# Patient Record
Sex: Male | Born: 1995 | Race: Black or African American | Hispanic: No | Marital: Single | Smoking: Never smoker
Health system: Southern US, Community
[De-identification: ages and names within clinical notes are randomized; demographics above are authoritative.]

---
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
## 2015-11-17 ENCOUNTER — Inpatient Hospital Stay (HOSPITAL_COMMUNITY)
Admission: EM | Admit: 2015-11-17 | Discharge: 2015-11-21 | DRG: 908 | Disposition: A | Discharge status: Home / Self Care | Payer: BLUE CROSS/BLUE SHIELD | Attending: Vascular Surgery | Admitting: Vascular Surgery

## 2015-11-17 ENCOUNTER — Emergency Department (HOSPITAL_COMMUNITY): Payer: BLUE CROSS/BLUE SHIELD

## 2015-11-17 ENCOUNTER — Emergency Department (HOSPITAL_COMMUNITY): Payer: BLUE CROSS/BLUE SHIELD | Admitting: Anesthesiology

## 2015-11-17 ENCOUNTER — Encounter (HOSPITAL_COMMUNITY): Payer: Self-pay | Admitting: Adult Health

## 2015-11-17 ENCOUNTER — Encounter (HOSPITAL_COMMUNITY)
Admission: EM | Disposition: A | Discharge status: Home / Self Care | Payer: Self-pay | Source: Home / Self Care | Attending: Vascular Surgery

## 2015-11-17 DIAGNOSIS — S45901A Unspecified injury of unspecified blood vessel at shoulder and upper arm level, right arm, initial encounter: Secondary | ICD-10-CM

## 2015-11-17 DIAGNOSIS — S5411XA Injury of median nerve at forearm level, right arm, initial encounter: Secondary | ICD-10-CM | POA: Diagnosis present

## 2015-11-17 DIAGNOSIS — S0181XA Laceration without foreign body of other part of head, initial encounter: Secondary | ICD-10-CM

## 2015-11-17 DIAGNOSIS — F10929 Alcohol use, unspecified with intoxication, unspecified: Secondary | ICD-10-CM | POA: Diagnosis present

## 2015-11-17 DIAGNOSIS — S45111A Laceration of brachial artery, right side, initial encounter: Principal | ICD-10-CM | POA: Diagnosis present

## 2015-11-17 DIAGNOSIS — W19XXXA Unspecified fall, initial encounter: Secondary | ICD-10-CM

## 2015-11-17 DIAGNOSIS — W3400XA Accidental discharge from unspecified firearms or gun, initial encounter: Secondary | ICD-10-CM

## 2015-11-17 DIAGNOSIS — D62 Acute posthemorrhagic anemia: Secondary | ICD-10-CM | POA: Diagnosis not present

## 2015-11-17 DIAGNOSIS — S41131A Puncture wound without foreign body of right upper arm, initial encounter: Secondary | ICD-10-CM | POA: Diagnosis present

## 2015-11-17 DIAGNOSIS — S45191A Other specified injury of brachial artery, right side, initial encounter: Secondary | ICD-10-CM

## 2015-11-17 DIAGNOSIS — S45101A Unspecified injury of brachial artery, right side, initial encounter: Secondary | ICD-10-CM | POA: Diagnosis present

## 2015-11-17 DIAGNOSIS — M79621 Pain in right upper arm: Secondary | ICD-10-CM | POA: Diagnosis present

## 2015-11-17 HISTORY — PX: FASCIOTOMY: SHX132

## 2015-11-17 HISTORY — PX: THROMBECTOMY BRACHIAL ARTERY: SHX6649

## 2015-11-17 LAB — COMPREHENSIVE METABOLIC PANEL
ALT: 33 U/L (ref 17–63)
AST: 24 U/L (ref 15–41)
Albumin: 4.1 g/dL (ref 3.5–5.0)
Alkaline Phosphatase: 65 U/L (ref 38–126)
Anion gap: 11 (ref 5–15)
BILIRUBIN TOTAL: 0.6 mg/dL (ref 0.3–1.2)
BUN: 13 mg/dL (ref 6–20)
CHLORIDE: 104 mmol/L (ref 101–111)
CO2: 21 mmol/L — ABNORMAL LOW (ref 22–32)
CREATININE: 0.94 mg/dL (ref 0.61–1.24)
Calcium: 8.8 mg/dL — ABNORMAL LOW (ref 8.9–10.3)
GFR calc Af Amer: >60 mL/min (ref >60)
Glucose, Bld: 152 mg/dL — ABNORMAL HIGH (ref 65–99)
Potassium: 3.1 mmol/L — ABNORMAL LOW (ref 3.5–5.1)
Sodium: 136 mmol/L (ref 135–145)
TOTAL PROTEIN: 6.7 g/dL (ref 6.5–8.1)

## 2015-11-17 LAB — CBC
HCT: 45 % (ref 39.0–52.0)
Hemoglobin: 14.6 g/dL (ref 13.0–17.0)
MCH: 29.4 pg (ref 26.0–34.0)
MCHC: 32.4 g/dL (ref 30.0–36.0)
MCV: 90.7 fL (ref 78.0–100.0)
Platelets: 187 10*3/uL (ref 150–400)
RBC: 4.96 MIL/uL (ref 4.22–5.81)
RDW: 13.9 % (ref 11.5–15.5)
WBC: 8.5 10*3/uL (ref 4.0–10.5)

## 2015-11-17 LAB — PREPARE RBC (CROSSMATCH)

## 2015-11-17 LAB — PROTIME-INR
INR: 0.95
PROTHROMBIN TIME: 12.7 s (ref 11.4–15.2)

## 2015-11-17 LAB — I-STAT CG4 LACTIC ACID, ED: LACTIC ACID, VENOUS: 4.32 mmol/L — AB (ref 0.5–1.9)

## 2015-11-17 LAB — ETHANOL: ALCOHOL ETHYL (B): 254 mg/dL — AB (ref <5)

## 2015-11-17 LAB — MRSA PCR SCREENING: MRSA BY PCR: NEGATIVE

## 2015-11-17 LAB — ABO/RH: ABO/RH(D): B POS

## 2015-11-17 LAB — BLOOD PRODUCT ORDER (VERBAL) VERIFICATION

## 2015-11-17 LAB — CDS SEROLOGY

## 2015-11-17 SURGERY — THROMBECTOMY, ARTERY, BRACHIAL
Anesthesia: General | Site: Arm Lower | Laterality: Right

## 2015-11-17 MED ORDER — PANTOPRAZOLE SODIUM 40 MG PO TBEC
40.0000 mg | DELAYED_RELEASE_TABLET | Freq: Every day | ORAL | Status: DC
  Administered 2015-11-21: 40 mg via ORAL
  Filled 2015-11-17 (×5): qty 1

## 2015-11-17 MED ORDER — METOPROLOL TARTRATE 5 MG/5ML IV SOLN: 2.0000 mg | INTRAVENOUS | Status: DC | PRN

## 2015-11-17 MED ORDER — FENTANYL CITRATE (PF) 100 MCG/2ML IJ SOLN
INTRAMUSCULAR | Status: AC
  Filled 2015-11-17: qty 2

## 2015-11-17 MED ORDER — ROCURONIUM BROMIDE 10 MG/ML (PF) SYRINGE
PREFILLED_SYRINGE | INTRAVENOUS | Status: AC
  Filled 2015-11-17: qty 10

## 2015-11-17 MED ORDER — SUCCINYLCHOLINE CHLORIDE 20 MG/ML IJ SOLN
INTRAMUSCULAR | Status: DC | PRN
  Administered 2015-11-17: 60 mg via INTRAVENOUS

## 2015-11-17 MED ORDER — GUAIFENESIN-DM 100-10 MG/5ML PO SYRP: 15.0000 mL | ORAL_SOLUTION | ORAL | Status: DC | PRN

## 2015-11-17 MED ORDER — SODIUM CHLORIDE 0.9 % IV SOLN: 500.0000 mL | Freq: Once | INTRAVENOUS | Status: DC | PRN

## 2015-11-17 MED ORDER — MORPHINE SULFATE (PF) 2 MG/ML IV SOLN: 2.0000 mg | Freq: Once | INTRAVENOUS | Status: DC

## 2015-11-17 MED ORDER — DIPHENHYDRAMINE HCL 50 MG/ML IJ SOLN
INTRAMUSCULAR | Status: AC
  Filled 2015-11-17: qty 1

## 2015-11-17 MED ORDER — SODIUM CHLORIDE 0.9 % IV SOLN
INTRAVENOUS | Status: DC | PRN
  Administered 2015-11-17 (×2): via INTRAVENOUS

## 2015-11-17 MED ORDER — MIDAZOLAM HCL 2 MG/2ML IJ SOLN
INTRAMUSCULAR | Status: AC
  Filled 2015-11-17: qty 2

## 2015-11-17 MED ORDER — MAGNESIUM SULFATE 2 GM/50ML IV SOLN
2.0000 g | Freq: Every day | INTRAVENOUS | Status: DC | PRN
Start: 2015-11-17 — End: 2015-11-21
  Filled 2015-11-17: qty 50

## 2015-11-17 MED ORDER — HYDROMORPHONE HCL 1 MG/ML IJ SOLN
INTRAMUSCULAR | Status: AC
  Filled 2015-11-17: qty 2

## 2015-11-17 MED ORDER — HYDROMORPHONE HCL 1 MG/ML IJ SOLN
0.2500 mg | INTRAMUSCULAR | Status: DC | PRN
  Administered 2015-11-20: 0.5 mg via INTRAVENOUS
  Filled 2015-11-17: qty 1

## 2015-11-17 MED ORDER — ONDANSETRON HCL 4 MG/2ML IJ SOLN
INTRAMUSCULAR | Status: AC
  Filled 2015-11-17: qty 2

## 2015-11-17 MED ORDER — PROPOFOL 10 MG/ML IV BOLUS
INTRAVENOUS | Status: AC
  Filled 2015-11-17: qty 40

## 2015-11-17 MED ORDER — HEPARIN SODIUM (PORCINE) 5000 UNIT/ML IJ SOLN
INTRAMUSCULAR | Status: DC | PRN
  Administered 2015-11-17: 04:00:00

## 2015-11-17 MED ORDER — ROCURONIUM BROMIDE 100 MG/10ML IV SOLN
INTRAVENOUS | Status: DC | PRN
  Administered 2015-11-17: 50 mg via INTRAVENOUS

## 2015-11-17 MED ORDER — ACETAMINOPHEN 160 MG/5ML PO SOLN: 325.0000 mg | ORAL | Status: DC | PRN

## 2015-11-17 MED ORDER — MIDAZOLAM HCL 5 MG/5ML IJ SOLN
INTRAMUSCULAR | Status: DC | PRN
  Administered 2015-11-17: 2 mg via INTRAVENOUS

## 2015-11-17 MED ORDER — SUGAMMADEX SODIUM 200 MG/2ML IV SOLN
INTRAVENOUS | Status: AC
  Filled 2015-11-17: qty 2

## 2015-11-17 MED ORDER — SODIUM CHLORIDE 0.9 % IV SOLN
Freq: Once | INTRAVENOUS | Status: AC
  Administered 2015-11-17: 03:00:00 via INTRAVENOUS

## 2015-11-17 MED ORDER — IOPAMIDOL (ISOVUE-300) INJECTION 61%
INTRAVENOUS | Status: AC
  Filled 2015-11-17: qty 50

## 2015-11-17 MED ORDER — HYDRALAZINE HCL 20 MG/ML IJ SOLN: 5.0000 mg | INTRAMUSCULAR | Status: DC | PRN

## 2015-11-17 MED ORDER — HEPARIN SODIUM (PORCINE) 1000 UNIT/ML IJ SOLN
INTRAMUSCULAR | Status: DC | PRN
  Administered 2015-11-17: 7000 [IU] via INTRAVENOUS

## 2015-11-17 MED ORDER — FENTANYL CITRATE (PF) 100 MCG/2ML IJ SOLN
INTRAMUSCULAR | Status: AC
  Filled 2015-11-17: qty 4

## 2015-11-17 MED ORDER — DEXTROSE 5 % IV SOLN
1.5000 g | Freq: Two times a day (BID) | INTRAVENOUS | Status: AC
  Administered 2015-11-17 – 2015-11-18 (×2): 1.5 g via INTRAVENOUS
  Filled 2015-11-17 (×3): qty 1.5

## 2015-11-17 MED ORDER — DIPHENHYDRAMINE HCL 50 MG/ML IJ SOLN
INTRAMUSCULAR | Status: DC | PRN
  Administered 2015-11-17: 25 mg via INTRAVENOUS

## 2015-11-17 MED ORDER — PROPOFOL 10 MG/ML IV BOLUS
INTRAVENOUS | Status: DC | PRN
  Administered 2015-11-17: 200 mg via INTRAVENOUS

## 2015-11-17 MED ORDER — FENTANYL CITRATE (PF) 250 MCG/5ML IJ SOLN
INTRAMUSCULAR | Status: DC | PRN
  Administered 2015-11-17: 150 ug via INTRAVENOUS
  Administered 2015-11-17: 50 ug via INTRAVENOUS
  Administered 2015-11-17: 100 ug via INTRAVENOUS
  Administered 2015-11-17 (×2): 50 ug via INTRAVENOUS

## 2015-11-17 MED ORDER — OXYCODONE HCL 5 MG PO TABS: 5.0000 mg | ORAL_TABLET | Freq: Once | ORAL | Status: DC | PRN

## 2015-11-17 MED ORDER — ACETAMINOPHEN 325 MG PO TABS: 325.0000 mg | ORAL_TABLET | ORAL | Status: DC | PRN

## 2015-11-17 MED ORDER — OXYCODONE HCL 5 MG/5ML PO SOLN: 5.0000 mg | Freq: Once | ORAL | Status: DC | PRN

## 2015-11-17 MED ORDER — ONDANSETRON HCL 4 MG/2ML IJ SOLN: 4.0000 mg | Freq: Once | INTRAMUSCULAR | Status: DC

## 2015-11-17 MED ORDER — ACETAMINOPHEN 325 MG RE SUPP: 325.0000 mg | RECTAL | Status: DC | PRN

## 2015-11-17 MED ORDER — SUCCINYLCHOLINE CHLORIDE 200 MG/10ML IV SOSY
PREFILLED_SYRINGE | INTRAVENOUS | Status: AC
  Filled 2015-11-17: qty 10

## 2015-11-17 MED ORDER — CEFAZOLIN SODIUM 1 G IJ SOLR
INTRAMUSCULAR | Status: AC
  Filled 2015-11-17: qty 20

## 2015-11-17 MED ORDER — IOPAMIDOL (ISOVUE-370) INJECTION 76%
INTRAVENOUS | Status: AC
  Administered 2015-11-17: 100 mL
  Filled 2015-11-17: qty 100

## 2015-11-17 MED ORDER — POTASSIUM CHLORIDE CRYS ER 20 MEQ PO TBCR: 20.0000 meq | EXTENDED_RELEASE_TABLET | Freq: Every day | ORAL | Status: DC | PRN

## 2015-11-17 MED ORDER — CEFAZOLIN SODIUM-DEXTROSE 2-4 GM/100ML-% IV SOLN
INTRAVENOUS | Status: AC
  Filled 2015-11-17: qty 100

## 2015-11-17 MED ORDER — BISACODYL 5 MG PO TBEC: 5.0000 mg | DELAYED_RELEASE_TABLET | Freq: Every day | ORAL | Status: DC | PRN

## 2015-11-17 MED ORDER — CEFAZOLIN SODIUM-DEXTROSE 2-3 GM-% IV SOLR
INTRAVENOUS | Status: DC | PRN
Start: 2015-11-17 — End: 2015-11-17
  Administered 2015-11-17 (×2): 2 g via INTRAVENOUS

## 2015-11-17 MED ORDER — SENNOSIDES-DOCUSATE SODIUM 8.6-50 MG PO TABS: 1.0000 | ORAL_TABLET | Freq: Every evening | ORAL | Status: DC | PRN

## 2015-11-17 MED ORDER — OXYCODONE-ACETAMINOPHEN 5-325 MG PO TABS
1.0000 | ORAL_TABLET | ORAL | Status: DC | PRN
  Administered 2015-11-17 – 2015-11-21 (×20): 2 via ORAL
  Filled 2015-11-17 (×20): qty 2

## 2015-11-17 MED ORDER — NALOXONE HCL 0.4 MG/ML IJ SOLN
INTRAMUSCULAR | Status: DC | PRN
  Administered 2015-11-17 (×2): 100 ug via INTRAVENOUS

## 2015-11-17 MED ORDER — PHENOL 1.4 % MT LIQD: 1.0000 | OROMUCOSAL | Status: DC | PRN

## 2015-11-17 MED ORDER — PHENYLEPHRINE 40 MCG/ML (10ML) SYRINGE FOR IV PUSH (FOR BLOOD PRESSURE SUPPORT)
PREFILLED_SYRINGE | INTRAVENOUS | Status: AC
  Filled 2015-11-17: qty 10

## 2015-11-17 MED ORDER — SODIUM CHLORIDE 0.9 % IV SOLN
INTRAVENOUS | Status: DC
  Administered 2015-11-17: 10:00:00 via INTRAVENOUS
  Administered 2015-11-18: 1000 mL via INTRAVENOUS

## 2015-11-17 MED ORDER — ALUM & MAG HYDROXIDE-SIMETH 200-200-20 MG/5ML PO SUSP: 15.0000 mL | ORAL | Status: DC | PRN

## 2015-11-17 MED ORDER — ARTIFICIAL TEARS OP OINT
TOPICAL_OINTMENT | OPHTHALMIC | Status: AC
  Filled 2015-11-17: qty 3.5

## 2015-11-17 MED ORDER — LACTATED RINGERS IV SOLN
INTRAVENOUS | Status: DC | PRN
  Administered 2015-11-17 (×2): via INTRAVENOUS

## 2015-11-17 MED ORDER — HYDROMORPHONE HCL 1 MG/ML IJ SOLN
INTRAMUSCULAR | Status: AC | PRN
  Administered 2015-11-17: 1 mg via INTRAVENOUS

## 2015-11-17 MED ORDER — ONDANSETRON HCL 4 MG/2ML IJ SOLN
INTRAMUSCULAR | Status: DC | PRN
  Administered 2015-11-17: 4 mg via INTRAVENOUS

## 2015-11-17 MED ORDER — DOCUSATE SODIUM 100 MG PO CAPS
100.0000 mg | ORAL_CAPSULE | Freq: Every day | ORAL | Status: DC
  Administered 2015-11-19 – 2015-11-21 (×2): 100 mg via ORAL
  Filled 2015-11-17 (×4): qty 1

## 2015-11-17 MED ORDER — LIDOCAINE 2% (20 MG/ML) 5 ML SYRINGE
INTRAMUSCULAR | Status: AC
  Filled 2015-11-17: qty 5

## 2015-11-17 MED ORDER — EPHEDRINE 5 MG/ML INJ
INTRAVENOUS | Status: AC
  Filled 2015-11-17: qty 10

## 2015-11-17 MED ORDER — LABETALOL HCL 5 MG/ML IV SOLN: 10.0000 mg | INTRAVENOUS | Status: DC | PRN

## 2015-11-17 MED ORDER — SODIUM CHLORIDE 0.9 % IR SOLN
Status: DC | PRN
  Administered 2015-11-17: 1000 mL

## 2015-11-17 MED ORDER — PHENYLEPHRINE HCL 10 MG/ML IJ SOLN
INTRAMUSCULAR | Status: DC | PRN
  Administered 2015-11-17: 40 ug via INTRAVENOUS

## 2015-11-17 MED ORDER — ONDANSETRON HCL 4 MG/2ML IJ SOLN
4.0000 mg | Freq: Once | INTRAMUSCULAR | Status: AC
  Administered 2015-11-17: 4 mg via INTRAVENOUS

## 2015-11-17 MED ORDER — MORPHINE SULFATE (PF) 2 MG/ML IV SOLN
2.0000 mg | INTRAVENOUS | Status: DC | PRN
  Administered 2015-11-17: 2 mg via INTRAVENOUS
  Administered 2015-11-17: 4 mg via INTRAVENOUS
  Administered 2015-11-17 – 2015-11-21 (×2): 2 mg via INTRAVENOUS
  Filled 2015-11-17: qty 1
  Filled 2015-11-17: qty 2
  Filled 2015-11-17 (×2): qty 1

## 2015-11-17 MED ORDER — CEFAZOLIN SODIUM-DEXTROSE 2-4 GM/100ML-% IV SOLN
2.0000 g | Freq: Once | INTRAVENOUS | Status: AC
  Administered 2015-11-17: 2 g via INTRAVENOUS

## 2015-11-17 MED ORDER — SODIUM CHLORIDE 0.9 % IJ SOLN
INTRAMUSCULAR | Status: AC
  Filled 2015-11-17: qty 10

## 2015-11-17 MED ORDER — LIDOCAINE HCL (CARDIAC) 20 MG/ML IV SOLN
INTRAVENOUS | Status: DC | PRN
  Administered 2015-11-17: 60 mg via INTRATRACHEAL

## 2015-11-17 MED ORDER — ONDANSETRON HCL 4 MG/2ML IJ SOLN
4.0000 mg | Freq: Four times a day (QID) | INTRAMUSCULAR | Status: DC | PRN
  Administered 2015-11-17 – 2015-11-20 (×2): 4 mg via INTRAVENOUS
  Filled 2015-11-17: qty 2

## 2015-11-17 SURGICAL SUPPLY — 61 items
BANDAGE ACE 4X5 VEL STRL LF (GAUZE/BANDAGES/DRESSINGS) ×2 IMPLANT
BANDAGE ELASTIC 4 VELCRO ST LF (GAUZE/BANDAGES/DRESSINGS) ×2 IMPLANT
BNDG ELASTIC 6X10 VLCR STRL LF (GAUZE/BANDAGES/DRESSINGS) ×2 IMPLANT
BNDG ESMARK 4X9 LF (GAUZE/BANDAGES/DRESSINGS) ×2 IMPLANT
BNDG GAUZE ELAST 4 BULKY (GAUZE/BANDAGES/DRESSINGS) ×6 IMPLANT
CANISTER SUCTION 2500CC (MISCELLANEOUS) ×2 IMPLANT
CATH EMB 3FR 80CM (CATHETERS) ×2 IMPLANT
CLIP TI MEDIUM 6 (CLIP) ×2 IMPLANT
CUFF TOURNIQUET SINGLE 18IN (TOURNIQUET CUFF) IMPLANT
CUFF TOURNIQUET SINGLE 24IN (TOURNIQUET CUFF) ×2 IMPLANT
CUFF TOURNIQUET SINGLE 34IN LL (TOURNIQUET CUFF) IMPLANT
DECANTER SPIKE VIAL GLASS SM (MISCELLANEOUS) IMPLANT
DERMABOND ADHESIVE PROPEN (GAUZE/BANDAGES/DRESSINGS) ×1
DERMABOND ADVANCED .7 DNX6 (GAUZE/BANDAGES/DRESSINGS) ×1 IMPLANT
DRAPE ORTHO SPLIT 77X108 STRL (DRAPES) ×1
DRAPE SURG ORHT 6 SPLT 77X108 (DRAPES) ×1 IMPLANT
ELECT REM PT RETURN 9FT ADLT (ELECTROSURGICAL) ×2
ELECTRODE REM PT RTRN 9FT ADLT (ELECTROSURGICAL) ×1 IMPLANT
GAUZE SPONGE 4X4 12PLY STRL (GAUZE/BANDAGES/DRESSINGS) ×2 IMPLANT
GAUZE SPONGE 4X4 16PLY XRAY LF (GAUZE/BANDAGES/DRESSINGS) IMPLANT
GAUZE XEROFORM 5X9 LF (GAUZE/BANDAGES/DRESSINGS) ×2 IMPLANT
GLOVE BIO SURGEON STRL SZ 6.5 (GLOVE) ×2 IMPLANT
GLOVE BIO SURGEON STRL SZ7 (GLOVE) ×2 IMPLANT
GLOVE BIO SURGEON STRL SZ7.5 (GLOVE) ×4 IMPLANT
GLOVE BIOGEL PI IND STRL 6.5 (GLOVE) ×2 IMPLANT
GLOVE BIOGEL PI IND STRL 7.0 (GLOVE) ×1 IMPLANT
GLOVE BIOGEL PI IND STRL 7.5 (GLOVE) ×3 IMPLANT
GLOVE BIOGEL PI INDICATOR 6.5 (GLOVE) ×2
GLOVE BIOGEL PI INDICATOR 7.0 (GLOVE) ×1
GLOVE BIOGEL PI INDICATOR 7.5 (GLOVE) ×3
GOWN STRL REUS W/ TWL LRG LVL3 (GOWN DISPOSABLE) ×3 IMPLANT
GOWN STRL REUS W/TWL LRG LVL3 (GOWN DISPOSABLE) ×3
KIT BASIN OR (CUSTOM PROCEDURE TRAY) ×2 IMPLANT
KIT ROOM TURNOVER OR (KITS) ×2 IMPLANT
LIQUID BAND (GAUZE/BANDAGES/DRESSINGS) ×2 IMPLANT
LOOP VESSEL MAXI BLUE (MISCELLANEOUS) ×2 IMPLANT
NS IRRIG 1000ML POUR BTL (IV SOLUTION) ×2 IMPLANT
PACK CV ACCESS (CUSTOM PROCEDURE TRAY) ×2 IMPLANT
PAD ABD 8X10 STRL (GAUZE/BANDAGES/DRESSINGS) ×6 IMPLANT
PAD ARMBOARD 7.5X6 YLW CONV (MISCELLANEOUS) ×4 IMPLANT
PAD CAST 4YDX4 CTTN HI CHSV (CAST SUPPLIES) ×1 IMPLANT
PADDING CAST COTTON 4X4 STRL (CAST SUPPLIES) ×1
SPONGE GAUZE 4X4 12PLY STER LF (GAUZE/BANDAGES/DRESSINGS) ×6 IMPLANT
SPONGE LAP 18X18 X RAY DECT (DISPOSABLE) ×4 IMPLANT
SPONGE SURGIFOAM ABS GEL 100 (HEMOSTASIS) IMPLANT
STAPLER VISISTAT 35W (STAPLE) ×6 IMPLANT
STRIP CLOSURE SKIN 1/2X4 (GAUZE/BANDAGES/DRESSINGS) ×2 IMPLANT
SUT PROLENE 3 0 SH 48 (SUTURE) ×2 IMPLANT
SUT PROLENE 5 0 C 1 24 (SUTURE) IMPLANT
SUT PROLENE 6 0 CC (SUTURE) ×2 IMPLANT
SUT SILK 3 0 (SUTURE) ×1
SUT SILK 3-0 18XBRD TIE 12 (SUTURE) ×1 IMPLANT
SUT VIC AB 2-0 SH 27 (SUTURE) ×1
SUT VIC AB 2-0 SH 27XBRD (SUTURE) ×1 IMPLANT
SUT VIC AB 3-0 SH 27 (SUTURE) ×6
SUT VIC AB 3-0 SH 27X BRD (SUTURE) ×6 IMPLANT
SUT VICRYL 4-0 PS2 18IN ABS (SUTURE) ×4 IMPLANT
SYR TB 1ML LUER SLIP (SYRINGE) ×2 IMPLANT
TAPE UMBILICAL COTTON 1/8X30 (MISCELLANEOUS) ×2 IMPLANT
UNDERPAD 30X30 (UNDERPADS AND DIAPERS) ×2 IMPLANT
WATER STERILE IRR 1000ML POUR (IV SOLUTION) ×2 IMPLANT
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
## 2015-11-17 NOTE — Progress Notes (Signed)
Patient underwent right volar forearm fasciotomy with evacuation of hematoma and partial wound closure  Will need secondary wound closure vs STSG in future   Ok for transfer from my standpoint
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
## 2015-11-17 NOTE — Anesthesia Procedure Notes (Signed)
Procedure Name: Intubation Date/Time: 11/17/2015 3:48 AM Performed by: Brien MatesMAHONY, Berdell Hostetler D Pre-anesthesia Checklist: Patient identified, Emergency Drugs available, Suction available, Patient being monitored and Timeout performed Patient Re-evaluated:Patient Re-evaluated prior to inductionOxygen Delivery Method: Circle system utilized Preoxygenation: Pre-oxygenation with 100% oxygen Intubation Type: IV induction, Rapid sequence and Cricoid Pressure applied Laryngoscope Size: Miller and 2 Grade View: Grade I Tube type: Subglottic suction tube Tube size: 7.5 mm Number of attempts: 1 Airway Equipment and Method: Stylet Placement Confirmation: ETT inserted through vocal cords under direct vision,  positive ETCO2 and breath sounds checked- equal and bilateral Secured at: 22 cm Tube secured with: Tape Dental Injury: Teeth and Oropharynx as per pre-operative assessment
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
## 2015-11-17 NOTE — Anesthesia Postprocedure Evaluation (Signed)
Anesthesia Post Note  Patient: William GillsKhalid R XXXHarris  Procedure(s) Performed: Procedure(s) (LRB): RIGHT ARM  Brachial ARTERY REPAIR using Right Reverse Greater Sapheous Vein. (Right) VOLAR FASCIOTOMY PARTIAL CLOSURE, RIGHT (Right)  Patient location during evaluation: PACU Anesthesia Type: General Level of consciousness: awake and alert Pain management: pain level controlled Vital Signs Assessment: post-procedure vital signs reviewed and stable Respiratory status: spontaneous breathing, nonlabored ventilation, respiratory function stable and patient connected to nasal cannula oxygen Cardiovascular status: blood pressure returned to baseline and stable Postop Assessment: no signs of nausea or vomiting Anesthetic complications: no    Last Vitals:  Vitals:   11/17/15 0909 11/17/15 0932  BP: 123/73 106/65  Pulse: (!) 129 (!) 119  Resp: 16 16  Temp: 36.6 C 37.7 C    Last Pain:  Vitals:   11/17/15 0932  TempSrc: Oral  PainSc: Asleep                 Randy Castrejon DAVID
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
## 2015-11-17 NOTE — Op Note (Signed)
See note [id number]
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
## 2015-11-17 NOTE — Progress Notes (Signed)
      Patient will f/u with [facility] hand surgeon  Dr. Doneen PoissonLinda Cendales [address] [facility] [place]  [place], [place] [phone]  Thomasena EdisOLLINS, ArizonaMMA [facility] HospitalMAUREEN PA-C

## 2015-11-17 NOTE — Anesthesia Procedure Notes (Signed)
Central Venous Catheter Insertion Performed by: anesthesiologist Patient location: Pre-op. Preanesthetic checklist: patient identified, IV checked, site marked, risks and benefits discussed, surgical consent, monitors and equipment checked, pre-op evaluation, timeout performed and anesthesia consent Position: supine Landmarks identified and Seldinger technique used Catheter size: 8 Fr Central line was placed.Double lumen Procedure performed without using ultrasound guided technique. Attempts: 1 Following insertion, dressing applied, line sutured and Biopatch. Post procedure assessment: blood return through all ports, free fluid flow and no air. Patient tolerated the procedure well with no immediate complications.
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
## 2015-11-17 NOTE — ED Notes (Signed)
Family updated as to patient's status.

## 2015-11-17 NOTE — Progress Notes (Signed)
   Met w/ patient.   Patient asked chaplain to relay well-being to family in waiting room (ED locked down)  Family unavailable.  Rev. [place] MDiv ThM
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
## 2015-11-18 ENCOUNTER — Encounter (HOSPITAL_COMMUNITY): Payer: Self-pay | Admitting: Vascular Surgery

## 2015-11-18 LAB — PREPARE FRESH FROZEN PLASMA
UNIT DIVISION: 0
Unit division: 0

## 2015-11-18 LAB — CBC
HCT: 22.3 % — ABNORMAL LOW (ref 39.0–52.0)
Hemoglobin: 7.4 g/dL — ABNORMAL LOW (ref 13.0–17.0)
MCH: 29.1 pg (ref 26.0–34.0)
MCHC: 33.2 g/dL (ref 30.0–36.0)
MCV: 87.8 fL (ref 78.0–100.0)
PLATELETS: 83 10*3/uL — AB (ref 150–400)
RBC: 2.54 MIL/uL — ABNORMAL LOW (ref 4.22–5.81)
RDW: 14.9 % (ref 11.5–15.5)
WBC: 8 10*3/uL (ref 4.0–10.5)

## 2015-11-18 LAB — POCT I-STAT, CHEM 8
BUN: 14 mg/dL (ref 6–20)
CREATININE: 1.2 mg/dL (ref 0.61–1.24)
Calcium, Ion: 1.1 mmol/L — ABNORMAL LOW (ref 1.15–1.40)
Chloride: 103 mmol/L (ref 101–111)
GLUCOSE: 148 mg/dL — AB (ref 65–99)
HEMATOCRIT: 47 % (ref 39.0–52.0)
HEMOGLOBIN: 16 g/dL (ref 13.0–17.0)
POTASSIUM: 3.2 mmol/L — AB (ref 3.5–5.1)
Sodium: 142 mmol/L (ref 135–145)
TCO2: 22 mmol/L (ref 0–100)

## 2015-11-18 LAB — POCT I-STAT 4, (NA,K, GLUC, HGB,HCT)
GLUCOSE: 158 mg/dL — AB (ref 65–99)
Glucose, Bld: 125 mg/dL — ABNORMAL HIGH (ref 65–99)
HCT: 28 % — ABNORMAL LOW (ref 39.0–52.0)
HCT: 26 % — ABNORMAL LOW (ref 39.0–52.0)
Hemoglobin: 9.5 g/dL — ABNORMAL LOW (ref 13.0–17.0)
Hemoglobin: 8.8 g/dL — ABNORMAL LOW (ref 13.0–17.0)
POTASSIUM: 4.6 mmol/L (ref 3.5–5.1)
POTASSIUM: 5.1 mmol/L (ref 3.5–5.1)
SODIUM: 142 mmol/L (ref 135–145)
Sodium: 143 mmol/L (ref 135–145)

## 2015-11-18 LAB — BASIC METABOLIC PANEL
ANION GAP: 4 — AB (ref 5–15)
BUN: 5 mg/dL — ABNORMAL LOW (ref 6–20)
CHLORIDE: 103 mmol/L (ref 101–111)
CO2: 30 mmol/L (ref 22–32)
CREATININE: 0.79 mg/dL (ref 0.61–1.24)
Calcium: 8.1 mg/dL — ABNORMAL LOW (ref 8.9–10.3)
GFR calc non Af Amer: >60 mL/min (ref >60)
Glucose, Bld: 100 mg/dL — ABNORMAL HIGH (ref 65–99)
Potassium: 4.1 mmol/L (ref 3.5–5.1)
SODIUM: 137 mmol/L (ref 135–145)

## 2015-11-18 NOTE — Progress Notes (Addendum)
[facility]  Subjective  - Pain in the palm.  He states he feels his hand and can move it.   Objective 129/70 86 98.3 F (36.8 C) (Oral) 15 100%  Intake/Output Summary (Last 24 hours) at 11/18/15 0723 Last data filed at 11/18/15 0634  Gross per 24 hour  Intake             4950 ml  Output             7425 ml  Net            -2475 ml    Wrist ext, finger flexion and sensation grossly intac Palpable radial pulses, hand and fingers warm well perfused Right thigh incisions clean, dry and soft.  Assessment/Planning: POD # 1  Repair of right brachial artery with reversed right greater saphenous vein Pending discharge he would like [place], [place] [facility] 11/18/2015 7:23 AM -- Agree with above, right hand warm well perfused, edematous He has pretty good abduction adduction of digits.  He has some clumsiness with fine motor skills in hand but some of this may be edema.  He is able to extend wrist but flexion slightly weaker Will change dressing tomorrow.  Hopefully close wounds on Wednesday  Acute blood loss anemia follow for now transfuse if hypotensive or symptomatic otherwise Transfer 2w  Fabienne Brunsharles Ramiel Forti, MD [facility] [place] Office: [phone] Pager: [phone]  Laboratory Lab Results:  Recent Labs  11/17/15 0145 11/18/15 0641  WBC 8.5 8.0  HGB 14.6 7.4*  HCT 45.0 22.3*  PLT 187 PENDING   BMET  Recent Labs  11/17/15 0145  NA 136  K 3.1*  CL 104  CO2 21*  GLUCOSE 152*  BUN 13  CREATININE 0.94  CALCIUM 8.8*    COAG Lab Results  Component Value Date   INR 0.95 11/17/2015   No results found for: PTT
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
## 2015-11-19 LAB — PREPARE FRESH FROZEN PLASMA
UNIT DIVISION: 0
UNIT DIVISION: 0
Unit division: 0
Unit division: 0

## 2015-11-19 NOTE — Progress Notes (Signed)
[facility]   Subjective  - feels ok   Objective (!) 123/59 78 98.5 F (36.9 C) (Oral) 20 100%  Intake/Output Summary (Last 24 hours) at 11/19/15 1027 Last data filed at 11/18/15 1300  Gross per 24 hour  Intake              480 ml  Output              750 ml  Net             -270 ml   2+ radial pulse Some edema in arm Muscle pink in forearm  Assessment/Planning: S/p right brachial artery repair Fasciotomy will try to close tomorrow Most likely d/c 10/12 will get [place] in [place]  William Cortez, William Cortez 11/19/2015 10:27 AM --  Laboratory Lab Results:  Recent Labs  11/17/15 0145  11/17/15 0600 11/18/15 0641  WBC 8.5  --   --  8.0  HGB 14.6  < > 8.8* 7.4*  HCT 45.0  < > 26.0* 22.3*  PLT 187  --   --  83*  < > = values in this interval not displayed. BMET  Recent Labs  11/17/15 0145 11/17/15 0153  11/17/15 0600 11/18/15 0641  NA 136 142  < > 143 137  K 3.1* 3.2*  < > 5.1 4.1  CL 104 103  --   --  103  CO2 21*  --   --   --  30  GLUCOSE 152* 148*  < > 125* 100*  BUN 13 14  --   --  5*  CREATININE 0.94 1.20  --   --  0.79  CALCIUM 8.8*  --   --   --  8.1*  < > = values in this interval not displayed.  COAG Lab Results  Component Value Date   INR 0.95 11/17/2015   No results found for: PTT

## 2015-11-20 ENCOUNTER — Encounter (HOSPITAL_COMMUNITY)
Admission: EM | Disposition: A | Discharge status: Home / Self Care | Payer: Self-pay | Source: Home / Self Care | Attending: Vascular Surgery

## 2015-11-20 ENCOUNTER — Inpatient Hospital Stay (HOSPITAL_COMMUNITY): Payer: BLUE CROSS/BLUE SHIELD | Admitting: Anesthesiology

## 2015-11-20 HISTORY — PX: FASCIOTOMY CLOSURE: SHX5829

## 2015-11-20 LAB — CBC
HCT: 21.9 % — ABNORMAL LOW (ref 39.0–52.0)
Hemoglobin: 7.3 g/dL — ABNORMAL LOW (ref 13.0–17.0)
MCH: 29.2 pg (ref 26.0–34.0)
MCHC: 33.3 g/dL (ref 30.0–36.0)
MCV: 87.6 fL (ref 78.0–100.0)
Platelets: 110 K/uL — ABNORMAL LOW (ref 150–400)
RBC: 2.5 MIL/uL — ABNORMAL LOW (ref 4.22–5.81)
RDW: 14.4 % (ref 11.5–15.5)
WBC: 7.1 K/uL (ref 4.0–10.5)

## 2015-11-20 SURGERY — FASCIOTOMY CLOSURE
Anesthesia: General | Site: Arm Upper | Laterality: Right

## 2015-11-20 MED ORDER — MIDAZOLAM HCL 5 MG/5ML IJ SOLN
INTRAMUSCULAR | Status: DC | PRN
  Administered 2015-11-20: 2 mg via INTRAVENOUS

## 2015-11-20 MED ORDER — CEFAZOLIN SODIUM 1 G IJ SOLR
INTRAMUSCULAR | Status: DC | PRN
  Administered 2015-11-20: 2 g via INTRAMUSCULAR

## 2015-11-20 MED ORDER — PROPOFOL 10 MG/ML IV BOLUS
INTRAVENOUS | Status: AC
  Filled 2015-11-20: qty 20

## 2015-11-20 MED ORDER — CEFAZOLIN IN D5W 1 GM/50ML IV SOLN
1.0000 g | Freq: Three times a day (TID) | INTRAVENOUS | Status: DC
  Administered 2015-11-21 (×2): 1 g via INTRAVENOUS
  Filled 2015-11-20 (×3): qty 50

## 2015-11-20 MED ORDER — LIDOCAINE 2% (20 MG/ML) 5 ML SYRINGE
INTRAMUSCULAR | Status: AC
  Filled 2015-11-20: qty 5

## 2015-11-20 MED ORDER — FENTANYL CITRATE (PF) 100 MCG/2ML IJ SOLN
INTRAMUSCULAR | Status: AC
  Filled 2015-11-20: qty 4

## 2015-11-20 MED ORDER — FENTANYL CITRATE (PF) 100 MCG/2ML IJ SOLN
INTRAMUSCULAR | Status: DC | PRN
  Administered 2015-11-20 (×4): 50 ug via INTRAVENOUS

## 2015-11-20 MED ORDER — PROPOFOL 10 MG/ML IV BOLUS
INTRAVENOUS | Status: DC | PRN
  Administered 2015-11-20: 200 mg via INTRAVENOUS

## 2015-11-20 MED ORDER — LACTATED RINGERS IV SOLN
INTRAVENOUS | Status: DC
  Administered 2015-11-20: 17:00:00 via INTRAVENOUS

## 2015-11-20 MED ORDER — 0.9 % SODIUM CHLORIDE (POUR BTL) OPTIME
TOPICAL | Status: DC | PRN
  Administered 2015-11-20: 1000 mL

## 2015-11-20 MED ORDER — LIDOCAINE HCL (CARDIAC) 20 MG/ML IV SOLN
INTRAVENOUS | Status: DC | PRN
  Administered 2015-11-20: 100 mg via INTRAVENOUS

## 2015-11-20 MED ORDER — MIDAZOLAM HCL 2 MG/2ML IJ SOLN
INTRAMUSCULAR | Status: AC
  Filled 2015-11-20: qty 2

## 2015-11-20 SURGICAL SUPPLY — 26 items
BANDAGE ACE 4X5 VEL STRL LF (GAUZE/BANDAGES/DRESSINGS) ×2 IMPLANT
BNDG GAUZE ELAST 4 BULKY (GAUZE/BANDAGES/DRESSINGS) ×4 IMPLANT
CANISTER SUCTION 2500CC (MISCELLANEOUS) ×2 IMPLANT
COVER SURGICAL LIGHT HANDLE (MISCELLANEOUS) ×2 IMPLANT
DRAPE HALF SHEET 40X57 (DRAPES) ×2 IMPLANT
DRAPE ORTHO SPLIT ENLRG (DRAPES) ×2 IMPLANT
ELECT REM PT RETURN 9FT ADLT (ELECTROSURGICAL) ×2
ELECTRODE REM PT RTRN 9FT ADLT (ELECTROSURGICAL) ×1 IMPLANT
GLOVE BIO SURGEON STRL SZ7.5 (GLOVE) ×2 IMPLANT
GOWN STRL REUS W/ TWL LRG LVL3 (GOWN DISPOSABLE) ×3 IMPLANT
GOWN STRL REUS W/TWL LRG LVL3 (GOWN DISPOSABLE) ×3
KIT BASIN OR (CUSTOM PROCEDURE TRAY) ×2 IMPLANT
KIT ROOM TURNOVER OR (KITS) ×2 IMPLANT
NS IRRIG 1000ML POUR BTL (IV SOLUTION) ×2 IMPLANT
PACK GENERAL/GYN (CUSTOM PROCEDURE TRAY) ×2 IMPLANT
PACK UNIVERSAL I (CUSTOM PROCEDURE TRAY) IMPLANT
PAD ARMBOARD 7.5X6 YLW CONV (MISCELLANEOUS) ×4 IMPLANT
SPONGE GAUZE 4X4 12PLY STER LF (GAUZE/BANDAGES/DRESSINGS) ×2 IMPLANT
STAPLER VISISTAT 35W (STAPLE) ×2 IMPLANT
STOCKINETTE IMPERVIOUS 9X36 MD (GAUZE/BANDAGES/DRESSINGS) ×2 IMPLANT
SUT ETHILON 3 0 PS 1 (SUTURE) IMPLANT
SUT VIC AB 2-0 CTX 36 (SUTURE) IMPLANT
SUT VIC AB 3-0 SH 27 (SUTURE)
SUT VIC AB 3-0 SH 27X BRD (SUTURE) IMPLANT
SUT VICRYL 4-0 PS2 18IN ABS (SUTURE) IMPLANT
WATER STERILE IRR 1000ML POUR (IV SOLUTION) ×2 IMPLANT

## 2015-11-20 NOTE — H&P (View-Only) (Signed)
[facility]  Subjective  - feels ok   Objective (!) 123/59 78 98.5 F (36.9 C) (Oral) 20 100%  Intake/Output Summary (Last 24 hours) at 11/19/15 1027 Last data filed at 11/18/15 1300  Gross per 24 hour  Intake              480 ml  Output              750 ml  Net             -270 ml   2+ radial pulse Some edema in arm Muscle pink in forearm  Assessment/Planning: S/p right brachial artery repair Fasciotomy will try to close tomorrow Most likely d/c 10/12 will get follow up care in [place]  Fields, Charles 11/19/2015 10:27 AM --  Laboratory Lab Results:  Recent Labs  11/17/15 0145  11/17/15 0600 11/18/15 0641  WBC 8.5  --   --  8.0  HGB 14.6  < > 8.8* 7.4*  HCT 45.0  < > 26.0* 22.3*  PLT 187  --   --  83*  < > = values in this interval not displayed. BMET  Recent Labs  11/17/15 0145 11/17/15 0153  11/17/15 0600 11/18/15 0641  NA 136 142  < > 143 137  K 3.1* 3.2*  < > 5.1 4.1  CL 104 103  --   --  103  CO2 21*  --   --   --  30  GLUCOSE 152* 148*  < > 125* 100*  BUN 13 14  --   --  5*  CREATININE 0.94 1.20  --   --  0.79  CALCIUM 8.8*  --   --   --  8.1*  < > = values in this interval not displayed.  COAG Lab Results  Component Value Date   INR 0.95 11/17/2015   No results found for: PTT     

## 2015-11-20 NOTE — Transfer of Care (Signed)
Immediate Anesthesia Transfer of Care Note  Patient: Jon GillsKhalid R XXXHarris  Procedure(s) Performed: Procedure(s): FASCIOTOMY CLOSURE WITH WASHOUT (Right)  Patient Location: PACU  Anesthesia Type:General  Level of Consciousness: awake, alert , oriented and patient cooperative  Airway & Oxygen Therapy: Patient Spontanous Breathing  Post-op Assessment: Report given to RN and Post -op Vital signs reviewed and stable  Post vital signs: Reviewed and stable  Last Vitals:  Vitals:   11/20/15 1323 11/20/15 1814  BP: 126/64   Pulse: 78 88  Resp: 20 16  Temp: 37.3 C     Last Pain:  Vitals:   11/20/15 1323  TempSrc: Oral  PainSc: 7       Patients Stated Pain Goal: 4 (11/19/15 2221)  Complications: none
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
## 2015-11-20 NOTE — Anesthesia Procedure Notes (Signed)
Procedure Name: LMA Insertion Date/Time: 11/20/2015 5:33 PM Performed by: Rosiland OzMEYERS, Taysean Wager Pre-anesthesia Checklist: Patient identified, Emergency Drugs available, Suction available, Patient being monitored and Timeout performed Patient Re-evaluated:Patient Re-evaluated prior to inductionOxygen Delivery Method: Circle system utilized Preoxygenation: Pre-oxygenation with 100% oxygen Intubation Type: IV induction LMA: LMA inserted LMA Size: 4.0 Number of attempts: 1 Placement Confirmation: breath sounds checked- equal and bilateral and positive ETCO2 Tube secured with: Tape Dental Injury: Teeth and Oropharynx as per pre-operative assessment
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
## 2015-11-21 ENCOUNTER — Encounter (HOSPITAL_COMMUNITY): Payer: Self-pay | Admitting: Vascular Surgery

## 2015-11-21 LAB — TYPE AND SCREEN
ABO/RH(D): B POS
ANTIBODY SCREEN: NEGATIVE
UNIT DIVISION: 0
UNIT DIVISION: 0
UNIT DIVISION: 0
UNIT DIVISION: 0
UNIT DIVISION: 0
UNIT DIVISION: 0
Unit division: 0
Unit division: 0
Unit division: 0
Unit division: 0
Unit division: 0

## 2015-11-21 MED ORDER — OXYCODONE-ACETAMINOPHEN 5-325 MG PO TABS: 1.0000 | ORAL_TABLET | ORAL | 0 refills | Status: AC | PRN

## 2015-11-21 MED ORDER — OXYCODONE-ACETAMINOPHEN 5-325 MG PO TABS: 1.0000 | ORAL_TABLET | ORAL | 0 refills | Status: DC | PRN

## 2015-11-21 MED ORDER — ASPIRIN EC 81 MG PO TBEC
81.0000 mg | DELAYED_RELEASE_TABLET | Freq: Every day | ORAL | Status: DC
Start: 2015-11-21 — End: 2015-11-21

## 2015-11-21 NOTE — Progress Notes (Signed)
[facility]  Subjective  - arm feels better   Objective (!) 115/53 77 98.4 F (36.9 C) (Axillary) 16 99%  Intake/Output Summary (Last 24 hours) at 11/21/15 0806 Last data filed at 11/20/15 1802  Gross per 24 hour  Intake              500 ml  Output                0 ml  Net              500 ml   Right arm incision intact Has range of motion although weak 2+ radial pulse, hand warm  Assessment/Planning: D/c home to [place] today Pt given numbers of physicians in [place] for follow up  Fabienne BrunsFields, Charles 11/21/2015 8:06 AM --  Laboratory Lab Results:  Recent Labs  11/20/15 0337  WBC 7.1  HGB 7.3*  HCT 21.9*  PLT 110*   BMET No results for input(s): NA, K, CL, CO2, GLUCOSE, BUN, CREATININE, CALCIUM in the last 72 hours.  COAG Lab Results  Component Value Date   INR 0.95 11/17/2015   No results found for: PTT
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
## 2015-11-21 NOTE — Anesthesia Postprocedure Evaluation (Signed)
Anesthesia Post Note  Patient: William Cortez  Procedure(s) Performed: Procedure(s) (LRB): FASCIOTOMY CLOSURE WITH WASHOUT (Right)  Patient location during evaluation: PACU Anesthesia Type: General Level of consciousness: awake and alert Pain management: pain level controlled Vital Signs Assessment: post-procedure vital signs reviewed and stable Respiratory status: spontaneous breathing, nonlabored ventilation, respiratory function stable and patient connected to nasal cannula oxygen Cardiovascular status: blood pressure returned to baseline and stable Postop Assessment: no signs of nausea or vomiting Anesthetic complications: no     Last Vitals:  Vitals:   11/20/15 2045 11/21/15 0512  BP: 126/68 (!) 115/53  Pulse: 74 77  Resp: 18 16  Temp: 37.3 C 36.9 C    Last Pain:  Vitals:   11/21/15 1000  TempSrc:   PainSc: 7    Pain Goal: Patients Stated Pain Goal: 4 (11/19/15 2221)               William Cortez, William Cortez

## 2015-11-21 NOTE — Progress Notes (Signed)
Reviewed d/c paperwork with pt and family. No further questions. Tele and IVs d/c'd. Call bell and phone within reach, will continue to monitor.

## 2015-11-21 NOTE — Op Note (Signed)
Procedure: Irrigation right arm, closure fasciotomy  Preop: right arm fasciotomy Postop: same Anesthesia: General  Asst: nurse  Findings: clean healthy appearing muscle  Operative details: After obtaining informed consent, the pt was taken to the operating room.  After induction of general anesthesia the right arm was prepped and draped.  The right arm wound was thoroughly irrigated with normal saline solution.  The muscle bellies were all clean pink and viable.  The skin was closed with staples.  The patient tolerated the procedure well.  Instrument sponge and needle counts were correct.  The patient was taken to PACU stable  Fabienne Brunsharles Copeland Neisen, MD [facility] Office: [phone] Pager: [phone]

## 2015-11-21 NOTE — Progress Notes (Signed)
Pts subclavian d/c'd per order and per protocol. Pt and family educated on need for bedrest x2510m. Pressure dressing applied. Call bell and phone within reach, will continue to monitor.

## 2015-11-21 NOTE — Progress Notes (Signed)
Pts R arm re-wrapped. Pt stated it was too tight and making his fingers numb. Pt has good cap-refill, fingers warm to touch.

## 2015-11-28 ENCOUNTER — Telehealth: Payer: Self-pay
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
## 2020-02-25 ENCOUNTER — Emergency Department: Admit: 2020-02-26

## 2020-02-25 DIAGNOSIS — G40909 Epilepsy, unspecified, not intractable, without status epilepticus: Secondary | ICD-10-CM

## 2020-02-25 NOTE — ED Provider Notes (Signed)
ED Provider Notes by Hezzie Bump, MD at 02/25/20 1955                Author: Hezzie Bump, MD  Service: Emergency Medicine  Author Type: Physician       Filed: 02/25/20 2123  Date of Service: 02/25/20 1955  Status: Signed          Editor: Hezzie Bump, MD (Physician)               EMERGENCY DEPARTMENT HISTORY AND PHYSICAL EXAM           Date: 02/25/2020   Patient Name: Brandon Galloway   Patient Age and Sex: 25 y.o.  male   MRN:  [id number]   CSN:  [id number]        History of Presenting Illness          Chief Complaint       Patient presents with        ?  Seizure             new onset and witnessed.  No history.           History Provided By: Patient      Ability to gather history was limited by:       HPI: Brandon Galloway,  25 y.o. male with no significant past medical history, brought to the emergency department  after his family reportedly witnessed a tonic-clonic seizure. Patient has no known seizure disorder, and denies any drug or alcohol use, and reports that he was feeling fine today. Family members reported that they heard a "thump" upstairs and found the  patient on the floor seizing. He was reportedly postictal upon EMS arrival, but by the time he arrived to the emergency department was completely normally alert and oriented. He denies any pain or injuries. He denies anything that he can think of that  may have precipitated or provoked a seizure today.      Location:     Quality:       Severity:     Duration:    Timing:       Context:     Modifying factors:    Associated symptoms:         Past History        ??  The patient's medical, surgical, and social history on file were reviewed by me today.      ??  The family history was reviewed by me today and was non-contributory, unless otherwise specified below:      Past Medical History:   History reviewed. No pertinent past medical history.      Past Surgical History:   History reviewed. No pertinent surgical history.       Family History:   History reviewed. No pertinent family history.      Social History:     Social History          Tobacco Use         ?  Smoking status:  Never Smoker     ?  Smokeless tobacco:  Not on file       Substance Use Topics         ?  Alcohol use:  Yes         ?  Drug use:  No           Current Medications:     No current facility-administered medications on file prior to encounter.  Current Outpatient Medications on File Prior to Encounter          Medication  Sig  Dispense  Refill           ?  [DISCONTINUED] ibuprofen (MOTRIN) 600 mg tablet  Take 1 Tab by mouth every eight (8) hours as needed for Pain.  30 Tab  0           ?  [DISCONTINUED] diazepam (VALIUM) 5 mg tablet  Take 1 Tab by mouth every eight (8) hours as needed (spasm). Max Daily Amount: 15 mg.  8 Tab  0           Allergies:   No Known Allergies     Review of Systems     ??  A complete ROS was reviewed by me today and was negative, unless otherwise specified below:      Review of Systems    Constitutional: Negative for fatigue and fever.    Respiratory: Negative for shortness of breath.     Cardiovascular: Negative for chest pain.    Gastrointestinal: Negative for abdominal pain.    Neurological: Positive for seizures. Negative for headaches.    All other systems reviewed and are negative.           Physical Exam     Vital Signs   Patient Vitals for the past 8 hrs:            Temp  Pulse  Resp  BP  SpO2            02/25/20 1926  --  --  --  --  100 %            02/25/20 1919  98.8 ??F (37.1 ??C)  94  18  133/62  100 %              Physical Exam   Vitals and nursing note reviewed.   Constitutional:        General: He is not in acute distress.     Appearance: Normal appearance. He is well-developed. He is not ill-appearing.    HENT:       Head: Normocephalic and atraumatic. No abrasion or contusion.      Comments: No signs of trauma or injury  Eyes:       General:         Right eye: No discharge.         Left eye: No discharge.       Conjunctiva/sclera: Conjunctivae normal.   Cardiovascular:       Rate and Rhythm: Normal rate and regular rhythm.      Heart sounds: Normal heart sounds. No murmur heard.        Pulmonary:       Effort: Pulmonary effort is normal. No respiratory distress.      Breath sounds: Normal breath sounds. No wheezing.    Abdominal:      General: There is no distension.      Palpations: Abdomen is soft.      Tenderness: There is no abdominal tenderness.     Musculoskeletal:          General: No deformity. Normal range of motion.      Cervical back: Normal range of motion and neck supple.    Skin:      General: Skin is warm and dry.      Findings: No abrasion, bruising, ecchymosis or rash.    Neurological:  General: No focal deficit present.      Mental Status: He is alert and oriented to person, place, and time.      GCS: GCS eye subscore is 4 . GCS verbal subscore is 5. GCS motor subscore is 6 .      Cranial Nerves: Cranial nerves are intact. No cranial nerve deficit, dysarthria or facial asymmetry.      Sensory: Sensation is intact.      Motor: Motor function is intact.   Psychiatric:         Mood and Affect: Mood normal.         Speech: Speech normal.          Behavior: Behavior normal.         Thought Content: Thought content normal.         Cognition and Memory: Cognition normal.               Diagnostic Study Results     Labs     Recent Results (from the past 24 hour(s))     CBC WITH AUTOMATED DIFF          Collection Time: 02/25/20  7:39 PM         Result  Value  Ref Range            WBC  8.8  4.1 - 11.1 K/uL       RBC  4.86  4.10 - 5.70 M/uL       HGB  15.0  12.1 - 17.0 g/dL       HCT  45.2  36.6 - 50.3 %       MCV  93.0  80.0 - 99.0 FL       MCH  30.9  26.0 - 34.0 PG       MCHC  33.2  30.0 - 36.5 g/dL       RDW  14.6 (H)  11.5 - 14.5 %       PLATELET  204  150 - 400 K/uL       MPV  10.4  8.9 - 12.9 FL       NRBC  0.0  0 PER 100 WBC       ABSOLUTE NRBC  0.00  0.00 - 0.01 K/uL       NEUTROPHILS  81 (H)  32 - 75 %        LYMPHOCYTES  8 (L)  12 - 49 %       MONOCYTES  8  5 - 13 %       EOSINOPHILS  1  0 - 7 %       BASOPHILS  1  0 - 1 %       IMMATURE GRANULOCYTES  1 (H)  0.0 - 0.5 %       ABS. NEUTROPHILS  7.1  1.8 - 8.0 K/UL       ABS. LYMPHOCYTES  0.7 (L)  0.8 - 3.5 K/UL       ABS. MONOCYTES  0.7  0.0 - 1.0 K/UL       ABS. EOSINOPHILS  0.1  0.0 - 0.4 K/UL       ABS. BASOPHILS  0.1  0.0 - 0.1 K/UL       ABS. IMM. GRANS.  0.1 (H)  0.00 - 0.04 K/UL       DF  SMEAR SCANNED          PLATELET COMMENTS  CLUMPED PLATELETS  RBC COMMENTS  NORMOCYTIC, NORMOCHROMIC          METABOLIC PANEL, COMPREHENSIVE          Collection Time: 02/25/20  7:39 PM         Result  Value  Ref Range            Sodium  138  136 - 145 mmol/L            Potassium  4.6  3.5 - 5.1 mmol/L       Chloride  105  97 - 108 mmol/L       CO2  24  21 - 32 mmol/L       Anion gap  9  5 - 15 mmol/L       Glucose  157 (H)  65 - 100 mg/dL       BUN  8  6 - 20 MG/DL       Creatinine  0.97  0.70 - 1.30 MG/DL       BUN/Creatinine ratio  8 (L)  12 - 20         GFR est AA  >60  >60 ml/min/1.86m       GFR est non-AA  >60  >60 ml/min/1.757m      Calcium  9.4  8.5 - 10.1 MG/DL       Bilirubin, total  0.3  0.2 - 1.0 MG/DL       ALT (SGPT)  48  12 - 78 U/L       AST (SGOT)  21  15 - 37 U/L       Alk. phosphatase  80  45 - 117 U/L       Protein, total  7.6  6.4 - 8.2 g/dL       Albumin  4.1  3.5 - 5.0 g/dL       Globulin  3.5  2.0 - 4.0 g/dL       A-G Ratio  1.2  1.1 - 2.2         ETHYL ALCOHOL          Collection Time: 02/25/20  7:39 PM         Result  Value  Ref Range            ALCOHOL(ETHYL),SERUM  <10  <10 MG/DL       CK          Collection Time: 02/25/20  7:39 PM         Result  Value  Ref Range            CK  273  39 - 308 U/L       DRUG SCREEN, URINE          Collection Time: 02/25/20  8:39 PM         Result  Value  Ref Range            AMPHETAMINES  Negative  NEG         BARBITURATES  Negative  NEG         BENZODIAZEPINES  Negative  NEG         COCAINE  Negative  NEG          METHADONE  Negative  NEG         OPIATES  Negative  NEG         PCP(PHENCYCLIDINE)  Negative  NEG         THC (TH-CANNABINOL)  Negative  NEG              Drug screen comment  (NOTE)             Radiologic Studies     CT HEAD WO CONT       Final Result     No acute intracranial abnormality.                 CT Results   (Last 48 hours)                                    02/25/20 1956    CT HEAD WO CONT  Final result            Impression:    No acute intracranial abnormality.                       Narrative:    HEAD CT WITHOUT CONTRAST: 02/25/2020 7:56 PM             INDICATION: first time seizure.  normal exam.  no injuries.             COMPARISON: None.             PROCEDURE: Axial images of the head were obtained without contrast. Coronal and      sagittal reformats were performed. CT dose reduction was achieved through use of      a standardized protocol tailored for this examination and automatic exposure      control for dose modulation.             FINDINGS: A small focus of circumscribed, subcortical, white matter hypodensity      in the median right frontal lobe (602-34, 601-140) is of doubtful significance.      An enlarged perivascular/Virchow Virl Cagey space is suspected. The ventricles and      sulci are appropriate in size and configuration for age. No loss of gray-white      differentiation to suggest late acute or early subacute infarction. No mass      effect or intracranial hemorrhage.                                 CXR Results   (Last 48 hours)          None                    Billable Procedures     Procedures        Medical Decision Making        I reviewed the patient's most recent Emergency Dept notes and diagnostic tests in formulating my MDM on today's visit.      Provider Notes (Medical Decision Making):    25 year old healthy male presenting with apparently first-time unprovoked tonic-clonic seizure. Briefly postictal afterward, now completely alert and oriented. No injuries       Well-appearing, no apparent distress. Alert and oriented, normal neurologic exam. Normal vital signs. Normal musculoskeletal exam, no injuries to the head or face or extremities.      All of his laboratories are normal.  Urine toxicology and alcohol level are normal.  Head CT is normal.  CK level is normal.      Suspect this is more of an uncomplicated syncopal event rather than a  true seizure.  At this time I do not feel that patient should be placed on driving restrictions nor started on Keppra or other antiepileptic for this one-time event which may or may  not have been a seizure.  He needs to follow closely with the neurology clinic and understands to stop driving and return to the ED if he has any further similar episodes      Marijean Bravo, MD   7:55 PM   02/25/2020       Consults:        Social History          Tobacco Use         ?  Smoking status:  Never Smoker     ?  Smokeless tobacco:  Not on file       Substance Use Topics         ?  Alcohol use:  Yes         ?  Drug use:  No           Medications Administered during ED course:   Medications - No data to display          Prescriptions from today's ED visit:   There are no discharge medications for this patient.         Diagnosis and Disposition        Disposition:  Discharged      Clinical Impression:       1.  First time seizure [facility])            Attestation:   I personally performed the services described in this documentation on this date 02/25/2020 for patient Brandon Galloway.     Marijean Bravo, MD            I was the first provider for this patient on this visit.  To the best of my ability I reviewed relevant prior medical records, electrocardiograms, laboratories, and radiologic studies.     The patient's presenting problems were discussed, and the patient was in agreement with the care plan formulated and outlined with them.        Marijean Bravo, MD      Please note that this dictation was completed with Dragon voice recognition  software. Quite often unanticipated grammatical, syntax, homophones, and other interpretive errors are inadvertently  transcribed by the computer software.    Please disregard these errors and excuse any errors that have escaped final proofreading.

## 2020-02-26 ENCOUNTER — Inpatient Hospital Stay
Admit: 2020-02-26 | Discharge: 2020-02-26 | Disposition: A | Discharge status: Home / Self Care | Attending: Emergency Medicine

## 2020-02-26 LAB — CBC WITH AUTO DIFFERENTIAL
Basophils %: 1 % (ref 0–1)
Basophils Absolute: 0.1 10*3/uL (ref 0.0–0.1)
Eosinophils %: 1 % (ref 0–7)
Eosinophils Absolute: 0.1 10*3/uL (ref 0.0–0.4)
Granulocyte Absolute Count: 0.1 10*3/uL — ABNORMAL HIGH (ref 0.00–0.04)
Hematocrit: 45.2 % (ref 36.6–50.3)
Hemoglobin: 15 g/dL (ref 12.1–17.0)
Immature Granulocytes %: 1 % — ABNORMAL HIGH (ref 0.0–0.5)
Lymphocytes %: 8 % — ABNORMAL LOW (ref 12–49)
Lymphocytes Absolute: 0.7 10*3/uL — ABNORMAL LOW (ref 0.8–3.5)
MCH: 30.9 PG (ref 26.0–34.0)
MCHC: 33.2 g/dL (ref 30.0–36.5)
MCV: 93 FL (ref 80.0–99.0)
MPV: 10.4 FL (ref 8.9–12.9)
Monocytes %: 8 % (ref 5–13)
Monocytes Absolute: 0.7 10*3/uL (ref 0.0–1.0)
NRBC Absolute: 0 10*3/uL (ref 0.00–0.01)
Neutrophils %: 81 % — ABNORMAL HIGH (ref 32–75)
Neutrophils Absolute: 7.1 10*3/uL (ref 1.8–8.0)
Nucleated RBCs: 0 PER 100 WBC
Platelets: 204 10*3/uL (ref 150–400)
RBC: 4.86 M/uL (ref 4.10–5.70)
RDW: 14.6 % — ABNORMAL HIGH (ref 11.5–14.5)
WBC: 8.8 10*3/uL (ref 4.1–11.1)

## 2020-02-26 LAB — DRUG SCREEN, URINE
AMPHETAMINES: NEGATIVE
Amphetamine Screen, Ur: NEGATIVE
BARBITURATES: NEGATIVE
BENZODIAZEPINES: NEGATIVE
Barbiturate Screen, Ur: NEGATIVE
Benzodiazepine Screen, Urine: NEGATIVE
COCAINE: NEGATIVE
Cocaine Screen Urine: NEGATIVE
METHADONE: NEGATIVE
Methadone Screen, Urine: NEGATIVE
OPIATES: NEGATIVE
Opiate Scrn, Ur: NEGATIVE
PCP(PHENCYCLIDINE): NEGATIVE
Phencyclidine (PCP), Screen, Urine: NEGATIVE
THC (TH-CANNABINOL): NEGATIVE
THC Screen, Urine: NEGATIVE

## 2020-02-26 LAB — COMPREHENSIVE METABOLIC PANEL
ALT: 48 U/L (ref 12–78)
AST: 21 U/L (ref 15–37)
Albumin/Globulin Ratio: 1.2 (ref 1.1–2.2)
Albumin: 4.1 g/dL (ref 3.5–5.0)
Alkaline Phosphatase: 80 U/L (ref 45–117)
Anion Gap: 9 mmol/L (ref 5–15)
BUN/Creatinine Ratio: 8 — ABNORMAL LOW (ref 12–20)
BUN: 8 MG/DL (ref 6–20)
CO2: 24 mmol/L (ref 21–32)
Calcium: 9.4 MG/DL (ref 8.5–10.1)
Chloride: 105 mmol/L (ref 97–108)
Creatinine: 0.97 MG/DL (ref 0.70–1.30)
GFR African American: >60 mL/min/{1.73_m2} (ref >60)
Globulin: 3.5 g/dL (ref 2.0–4.0)
Glucose: 157 mg/dL — ABNORMAL HIGH (ref 65–100)
Potassium: 4.6 mmol/L (ref 3.5–5.1)
Sodium: 138 mmol/L (ref 136–145)
Total Bilirubin: 0.3 MG/DL (ref 0.2–1.0)
Total Protein: 7.6 g/dL (ref 6.4–8.2)
eGFR NON-AA: >60 mL/min/{1.73_m2} (ref >60)

## 2020-02-26 LAB — CK
CK: 273 U/L (ref 39–308)
Total CK: 273 U/L (ref 39–308)

## 2020-02-26 LAB — ETHYL ALCOHOL
ALCOHOL(ETHYL),SERUM: <10 MG/DL (ref <10)
Ethanol Lvl: <10 mg/dL (ref <10)

## 2020-02-26 LAB — METABOLIC PANEL, COMPREHENSIVE
A-G Ratio: 1.2 (ref 1.1–2.2)
ALT (SGPT): 48 U/L (ref 12–78)
AST (SGOT): 21 U/L (ref 15–37)
Albumin: 4.1 g/dL (ref 3.5–5.0)
Alk. phosphatase: 80 U/L (ref 45–117)
Anion gap: 9 mmol/L (ref 5–15)
BUN/Creatinine ratio: 8 — ABNORMAL LOW (ref 12–20)
BUN: 8 MG/DL (ref 6–20)
Bilirubin, total: 0.3 MG/DL (ref 0.2–1.0)
CO2: 24 mmol/L (ref 21–32)
Calcium: 9.4 MG/DL (ref 8.5–10.1)
Chloride: 105 mmol/L (ref 97–108)
Creatinine: 0.97 MG/DL (ref 0.70–1.30)
GFR est AA: >60 mL/min/{1.73_m2} (ref >60)
GFR est non-AA: >60 mL/min/{1.73_m2} (ref >60)
Globulin: 3.5 g/dL (ref 2.0–4.0)
Glucose: 157 mg/dL — ABNORMAL HIGH (ref 65–100)
Potassium: 4.6 mmol/L (ref 3.5–5.1)
Protein, total: 7.6 g/dL (ref 6.4–8.2)
Sodium: 138 mmol/L (ref 136–145)

## 2020-02-26 LAB — CBC WITH AUTOMATED DIFF
ABS. BASOPHILS: 0.1 10*3/uL (ref 0.0–0.1)
ABS. EOSINOPHILS: 0.1 10*3/uL (ref 0.0–0.4)
ABS. IMM. GRANS.: 0.1 10*3/uL — ABNORMAL HIGH (ref 0.00–0.04)
ABS. LYMPHOCYTES: 0.7 10*3/uL — ABNORMAL LOW (ref 0.8–3.5)
ABS. MONOCYTES: 0.7 10*3/uL (ref 0.0–1.0)
ABS. NEUTROPHILS: 7.1 10*3/uL (ref 1.8–8.0)
ABSOLUTE NRBC: 0 10*3/uL (ref 0.00–0.01)
BASOPHILS: 1 % (ref 0–1)
EOSINOPHILS: 1 % (ref 0–7)
HCT: 45.2 % (ref 36.6–50.3)
HGB: 15 g/dL (ref 12.1–17.0)
IMMATURE GRANULOCYTES: 1 % — ABNORMAL HIGH (ref 0.0–0.5)
LYMPHOCYTES: 8 % — ABNORMAL LOW (ref 12–49)
MCH: 30.9 PG (ref 26.0–34.0)
MCHC: 33.2 g/dL (ref 30.0–36.5)
MCV: 93 FL (ref 80.0–99.0)
MONOCYTES: 8 % (ref 5–13)
MPV: 10.4 FL (ref 8.9–12.9)
NEUTROPHILS: 81 % — ABNORMAL HIGH (ref 32–75)
NRBC: 0 PER 100 WBC
PLATELET: 204 10*3/uL (ref 150–400)
RBC: 4.86 M/uL (ref 4.10–5.70)
RDW: 14.6 % — ABNORMAL HIGH (ref 11.5–14.5)
WBC: 8.8 10*3/uL (ref 4.1–11.1)

## 2020-03-01 ENCOUNTER — Ambulatory Visit: Attending: Vascular Neurology

## 2020-03-01 ENCOUNTER — Ambulatory Visit: Admit: 2020-03-01 | Attending: Vascular Neurology

## 2020-03-01 DIAGNOSIS — R569 Unspecified convulsions: Secondary | ICD-10-CM

## 2020-03-01 NOTE — Progress Notes (Signed)
Referring Physician: ER    Reason for Consultation: Seizure    Chief Complaint: Seizure    History of Present Illness:   Brandon Galloway is a 25 y.o. male with no prior medical history presents to neurology clinic for evaluation patient had an event concerning for possible seizure.  Patient reports that he had been at his baseline state of health on 02/25/2020 he had an event that was concerning for a seizure.  Patient reports that prior to the event he had just taken a shower and got dressed but started feeling very hot.  He was thinking about taking another shower however he had an episode where he lost consciousness.  He does not remember feeling lightheaded or dizzy prior to this event.  His mother who was downstairs heard a thump and went upstairs to check on him and found him unconscious with some foaming at the mouth.  EMS was called and patient started coming around within 5 to 10 minutes.  He denies any postictal confusion however was fatigued during the day after the event.  Patient reports that he had been doing well and had slept fairly well the night before.  He does report he went binge drinking the night before however does not remember clearly how much alcohol he had.  He denies any prior history of seizures.  He denies any palpitations.  In the ER he underwent basic laboratory studies including a drug screen and alcohol level both of which were negative.  He did have a noncontrast head CT that was reported as normal as well.  He was discharged later that day.  He has had no further events.    Past medical history  None    Surgical history  None    Family history  Maternal grandfather with a heart attack in his 41s    Social History     Tobacco Use   ??? Smoking status: Never Smoker   ??? Smokeless tobacco: Not on file   Substance Use Topics   ??? Alcohol use: Yes        No Known Allergies     Prior to Admission medications    Not on File       Review of Systems:  General, constitutional: negative  Eyes,  vision: negative  Ears, nose, throat: negative  Cardiovascular, heart: negative  Respiratory: negative  Gastrointestinal: negative  Genitourinary: negative  Musculoskeletal: negative  Skin and integumentary: negative  Psychiatric: negative  Endocrine: negative  Neurological: negative, except for HPI  Hematologic/lymphatic: negative  Allergy/immunology: negative      Visit Vitals  BP 126/82   Pulse 97   Temp 97.4 ??F (36.3 ??C) (Temporal)   Resp 18   SpO2 97%       Physical Exam:  General:  no acute distress  Neck: no carotid bruits  Lungs: clear to auscultation  Heart:  no murmurs, regular rate and rhythm   Lower extremity: no edema    Neurological exam:  Mental Status: Awake, alert, oriented to person, place and time  Attention and Concentration: able to state the days of the week backwards   Speech and Language: No dysarthria. Able to name, repeat and follow commands   Fund of knowledge was preserved    Cranial nerves: II-XII  Pupils equal and reactive, visual fields intact by confrontation   Extraocular movements intact, no evidence of nystagmus or ptosis   Facial sensation intact   Facial movements symmetric   Hearing intact to soft  rub bilaterally   Shoulder shrug symmetric and strong   Tongue protrusion full and midline without fasciculation or atrophy    Motor:   Normal tone and Bulk   Drift: No evidence of pronator drift     Strength testing:   deltoid triceps biceps Wrist ext. Wrist flex. intrinsics   Right 5 5 5 5 5 5    Left 5 5 5 5 5 5       Hip flex. Hip ext. Knee ext.  Knee flex Dorsi flex Plantar flex   Right  5 5 5 5 5 5    Left  5 5 5 5 5 5        Sensory:  Intact to light touch and pinprick.  There is no extinction    Reflexes:     Biceps Triceps  Brachiorad Patellar Achilles Plantar Hoffmans   Right  2 2 2 2 2  Down Neg   Left  2 2 2 2 2  Down Neg        Cerebellar testing:  No dysmetria. Normal rapid alternating movements; finger-to-nose and heel-to- shin testing are within normal limits.    Romberg:  absent    Gait: steady    Data:     INTERNAL RECORDS:  The patient's electronic medical record was reviewed.  The relevant details include:    Lab Results   Component Value Date/Time    Sodium 138 02/25/2020 07:39 PM    Potassium 4.6 02/25/2020 07:39 PM    Chloride 105 02/25/2020 07:39 PM    Glucose 157 (H) 02/25/2020 07:39 PM    BUN 8 02/25/2020 07:39 PM    Creatinine 0.97 02/25/2020 07:39 PM    Calcium 9.4 02/25/2020 07:39 PM    WBC 8.8 02/25/2020 07:39 PM    HCT 45.2 02/25/2020 07:39 PM    HGB 15.0 02/25/2020 07:39 PM    PLATELET 204 02/25/2020 07:39 PM       CT Results (maximum last 3):  Results from Hospital Encounter encounter on 02/25/20    CT HEAD WO CONT    Narrative  HEAD CT WITHOUT CONTRAST: 02/25/2020 7:56 PM    INDICATION: first time seizure.  normal exam.  no injuries.    COMPARISON: None.    PROCEDURE: Axial images of the head were obtained without contrast. Coronal and  sagittal reformats were performed. CT dose reduction was achieved through use of  a standardized protocol tailored for this examination and automatic exposure  control for dose modulation.    FINDINGS: A small focus of circumscribed, subcortical, white matter hypodensity  in the median right frontal lobe (602-34, 601-140) is of doubtful significance.  An enlarged perivascular/Virchow 02/27/2020 space is suspected. The ventricles and  sulci are appropriate in size and configuration for age. No loss of gray-white  differentiation to suggest late acute or early subacute infarction. No mass  effect or intracranial hemorrhage.    Impression  No acute intracranial abnormality.      MRI Results (maximum last 3):  No results found for this or any previous visit.      Assessment and Plan   Brandon Galloway is a 25 y.o. male with no prior medical history presents to neurology clinic for evaluation patient had an event concerning for possible seizure vs. Syncope.  His neurological exam is unremarkable.  - I recommended that patient obtain an MRI of  the brain with and without contrast to determine if there is any evidence of cortical dysplasia which would increase his risk for  seizure recurrence  - I have also placed an order for routine EEG to rule out epileptiform discharges.  - Given that this event may be based in syncope, I have also ordered an echocardiogram to rule out structural abnormalities.  -We may need to consider an event monitor if the above work-up is unremarkable.  - We discussed [place] state law regarding driving and he is not to drive for 6 months since the seizure.  Patient verbalized understanding  - We decided to 80s at this time as this is not clearly based in seizure.  - We did discuss seizure precautions.    Return to clinic once work-up is complete      I have discussed the diagnosis with the patient and the intended plan as seen in the above orders. Patient is in agreement. The patient has received an after-visit summary and questions were answered concerning future plans. I have discussed medication side effects and warnings with the patient as well.        Signed By:  Kara Pacer, MD     March 01, 2020

## 2020-03-03 ENCOUNTER — Inpatient Hospital Stay
Admit: 2020-03-03 | Discharge: 2020-03-04 | Disposition: A | Discharge status: Home / Self Care | Attending: Emergency Medicine

## 2020-03-03 ENCOUNTER — Emergency Department: Admit: 2020-03-03

## 2020-03-03 DIAGNOSIS — R569 Unspecified convulsions: Secondary | ICD-10-CM

## 2020-03-03 LAB — COMPREHENSIVE METABOLIC PANEL
ALT: 49 U/L (ref 12–78)
AST: 19 U/L (ref 15–37)
Albumin/Globulin Ratio: 1.1 (ref 1.1–2.2)
Albumin: 4 g/dL (ref 3.5–5.0)
Alkaline Phosphatase: 80 U/L (ref 45–117)
Anion Gap: 5 mmol/L (ref 5–15)
BUN/Creatinine Ratio: 9 — ABNORMAL LOW (ref 12–20)
BUN: 9 MG/DL (ref 6–20)
CO2: 24 mmol/L (ref 21–32)
Calcium: 8.9 MG/DL (ref 8.5–10.1)
Chloride: 108 mmol/L (ref 97–108)
Creatinine: 1.02 MG/DL (ref 0.70–1.30)
GFR African American: >60 mL/min/{1.73_m2} (ref >60)
Globulin: 3.6 g/dL (ref 2.0–4.0)
Glucose: 158 mg/dL — ABNORMAL HIGH (ref 65–100)
Potassium: 4.9 mmol/L (ref 3.5–5.1)
Sodium: 137 mmol/L (ref 136–145)
Total Bilirubin: 0.3 MG/DL (ref 0.2–1.0)
Total Protein: 7.6 g/dL (ref 6.4–8.2)
eGFR NON-AA: >60 mL/min/{1.73_m2} (ref >60)

## 2020-03-03 LAB — METABOLIC PANEL, COMPREHENSIVE
A-G Ratio: 1.1 (ref 1.1–2.2)
ALT (SGPT): 49 U/L (ref 12–78)
AST (SGOT): 19 U/L (ref 15–37)
Albumin: 4 g/dL (ref 3.5–5.0)
Alk. phosphatase: 80 U/L (ref 45–117)
Anion gap: 5 mmol/L (ref 5–15)
BUN/Creatinine ratio: 9 — ABNORMAL LOW (ref 12–20)
BUN: 9 MG/DL (ref 6–20)
Bilirubin, total: 0.3 MG/DL (ref 0.2–1.0)
CO2: 24 mmol/L (ref 21–32)
Calcium: 8.9 MG/DL (ref 8.5–10.1)
Chloride: 108 mmol/L (ref 97–108)
Creatinine: 1.02 MG/DL (ref 0.70–1.30)
GFR est AA: >60 mL/min/{1.73_m2} (ref >60)
GFR est non-AA: >60 mL/min/{1.73_m2} (ref >60)
Globulin: 3.6 g/dL (ref 2.0–4.0)
Glucose: 158 mg/dL — ABNORMAL HIGH (ref 65–100)
Potassium: 4.9 mmol/L (ref 3.5–5.1)
Protein, total: 7.6 g/dL (ref 6.4–8.2)
Sodium: 137 mmol/L (ref 136–145)

## 2020-03-03 MED ORDER — SODIUM CHLORIDE 0.9 % IV
5005 mg/5 mL | INTRAVENOUS | Status: AC
Start: 2020-03-03 — End: 2020-03-03
  Administered 2020-03-03: 21:00:00 via INTRAVENOUS

## 2020-03-03 MED ORDER — GADOTERIDOL 279.3 MG/ML INTRAVENOUS SOLUTION
279.3 mg/mL | Freq: Once | INTRAVENOUS | Status: AC
Start: 2020-03-03 — End: 2020-03-03
  Administered 2020-03-03: 23:00:00 via INTRAVENOUS

## 2020-03-03 MED ORDER — LEVETIRACETAM 500 MG TAB
500 mg | ORAL_TABLET | Freq: Two times a day (BID) | ORAL | 0 refills | Status: AC
Start: 2020-03-03 — End: ?

## 2020-03-03 MED ORDER — FOLIC ACID 5 MG/ML IJ SOLN
5 mg/mL | Freq: Once | INTRAMUSCULAR | Status: AC
Start: 2020-03-03 — End: 2020-03-03
  Administered 2020-03-03: 21:00:00 via INTRAVENOUS

## 2020-03-03 MED FILL — SODIUM CHLORIDE 0.9 % IV: INTRAVENOUS | Qty: 1000

## 2020-03-03 MED FILL — LEVETIRACETAM 500 MG/5 ML IV SOLN: 500 mg/5 mL | INTRAVENOUS | Qty: 10

## 2020-03-03 NOTE — ED Notes (Signed)
Pt is alert and ambulatory to restroom with no difficulty.

## 2020-03-03 NOTE — Progress Notes (Signed)
-  Please complete MRI History and Safety Screening Form   - Patient cannot be scanned until this form is completed, including signatures, and reviewed in MRI to ensure patient is SAFE and eligible for MRI.  - CALL MRI when this has been successfully completed at [phone].

## 2020-03-03 NOTE — ED Notes (Signed)
I have reviewed written and verbal discharge instructions with the patient.  The patient verbalized understanding. I.V. removed and pt ambulatory at time of discharge.

## 2020-03-03 NOTE — ED Provider Notes (Signed)
ED Provider Notes by Royanne Foots, MD at 03/03/20 1446                Author: Royanne Foots, MD  Service: EMERGENCY  Author Type: Physician       Filed: 03/04/20 1458  Date of Service: 03/03/20 1446  Status: Signed          Editor: Royanne Foots, MD (Physician)               EMERGENCY DEPARTMENT HISTORY AND PHYSICAL EXAM           Date: 03/03/2020   Patient Name: Brandon Galloway   Patient Age and Sex: 25 y.o.  male         History of Presenting Illness          Chief Complaint       Patient presents with        ?  Seizure             Per EMS, mother witnessed 1-2 minute convulsion episode. Pt reported drinking etoh weekly, last drink yesterday evening. Pt denied any pain, A/Ox4.            History Provided By: Patient      HPI: Brandon Galloway is a 25 year old  male presenting for seizure.  Patient states that 1 week ago same thing happened.  Today Per EMS, mother heard a thump and witnessed her son having a 1 to 2-minute tonic-clonic seizure.  Patient states that he does drink intermittently and on the weekends  does binge drink.  Last week when he had the seizure the night before he had been binge drinking.  Then last night he drank a lot with his friends.  Denies hitting his head, any pain currently.  Denies any new medications, head trauma.  He did see Dr.  Harold Hedge, neurology in the last couple of days and she had ordered an MRI brain for an abnormality on his CT scan.      There are no other complaints, changes, or physical findings at this time.      PCP: Thressa Sheller, MD        No current facility-administered medications on file prior to encounter.          No current outpatient medications on file prior to encounter.             Past History        Past Medical History:   No past medical history on file.      Past Surgical History:   No past surgical history on file.      Family History:   No family history on file.      Social History:     Social History          Tobacco Use         ?   Smoking status:  Never Smoker     ?  Smokeless tobacco:  Not on file       Substance Use Topics         ?  Alcohol use:  Yes         ?  Drug use:  No           Allergies:   No Known Allergies           Review of Systems     Review of Systems    Constitutional: Negative for chills and fever.  Respiratory: Negative for cough and shortness of breath.     Cardiovascular: Negative for chest pain.    Gastrointestinal: Negative for abdominal pain, constipation, diarrhea, nausea and vomiting.    Genitourinary: Negative for dysuria, frequency and hematuria.    Neurological: Positive for seizures. Negative for weakness and numbness.    All other systems reviewed and are negative.            Physical Exam     Physical Exam   Constitutional :        General: He is not in acute distress.     Appearance: He is well-developed.    HENT:       Head: Normocephalic and atraumatic.      Nose: Nose normal.      Mouth/Throat:      Mouth: Mucous membranes are moist.    Eyes:       Extraocular Movements: Extraocular movements intact.      Conjunctiva/sclera: Conjunctivae normal.   Cardiovascular:       Comments: Well perfused  Pulmonary:       Effort: Pulmonary effort is normal. No respiratory distress.   Musculoskeletal:          General: Normal range of motion.      Cervical back: Normal range of motion.     Neurological:       General: No focal deficit present.      Mental Status: He is alert and oriented to person, place, and time.      Comments: Patient ambulatory.  5 out of 5 strength in all 4 extremities.   No tremors, no tongue fasciculation.    Psychiatric:         Mood and Affect: Mood normal.                Diagnostic Study Results        Labs -    No results found for this or any previous visit (from the past 12 hour(s)).      Radiologic Studies -      MRI BRAIN W WO CONT       Final Result     1. No evident seizure focus. Normal brain MRI.                      CT Results   (Last 48 hours)          None                 CXR  Results   (Last 48 hours)          None                       Medical Decision Making     I am the first provider for this patient.      I reviewed the vital signs, available nursing notes, past medical history, past surgical history, family history and social history.      Vital Signs-Reviewed the patient's vital signs.   No data found.      Records Reviewed: Nursing Notes and Old Medical Records      Provider Notes (Medical Decision Making):    Patient presenting with second seizure in his lifetime.  Could be epilepsy but high concern for possible alcohol withdrawal.  He states he does not drink heavily every day but more so on the weekends.   Plan will be to check a CMP  just to make sure his alcohol levels are normal, and get the MRI of his brain given his CT abnormality.      ED Course:    Initial assessment performed. The patients presenting problems have been discussed, and they are in agreement with the care plan formulated and outlined with them.  I have encouraged them to ask questions as they arise throughout their visit.        ED Course as of 03/04/20 1457       Sun Mar 03, 2020        1450  Nurse reported that CIWA was 2. [JS]     1529  Patient signed out to Dr. Reed Pandy.  Plan will be to follow-up the CMP, MRI and if everything comes back negative and his CIWA continues to be very very  low, discharge home and follow-up with neurology on Keppra. [JS]              ED Course User Index   [JS] Royanne Foots, MD        Critical Care Time:    0         Disposition:      Admission Note:   Discharge Note:   The patient has been re-evaluated and is ready for discharge. Reviewed available results with patient. Counseled patient on diagnosis and care plan. Patient has expressed understanding, and all questions  have been answered. Patient agrees with plan and agrees to follow up as recommended, or to return to the ED if their symptoms worsen. Discharge instructions have been provided and explained to the  patient, along with reasons to return to the ED.        PLAN:     Discharge Medication List as of 03/03/2020  8:14 PM              START taking these medications          Details        levETIRAcetam (Keppra) 500 mg tablet  Take 1 Tablet by mouth two (2) times a day., Normal, Disp-60 Tablet, R-0                   1.     2.      Follow-up Information               Follow up With  Specialties  Details  Why  Contact Info              Kara Pacer, MD  Neurology  Schedule an appointment as soon as possible for a visit     [address]   [address]   [place] [address]   [phone]                3.  Return to ED if worse               Diagnosis        Clinical Impression:       1.  Seizure (HCC)         2.  Alcohol consumption binge drinking            Attestations:   Royanne Foots, M.D.              Please note that this dictation was completed with Dragon, the computer voice recognition software.  Quite often unanticipated grammatical, syntax, homophones, and other interpretive errors are inadvertently  transcribed by the computer software.  Please  disregard these errors.  Please excuse any errors that have escaped final proofreading.  Thank you.

## 2020-03-03 NOTE — ED Notes (Signed)
Pt taken to MRI.

## 2020-03-15 ENCOUNTER — Ambulatory Visit

## 2020-04-29 ENCOUNTER — Inpatient Hospital Stay
Admit: 2020-04-29 | Discharge: 2020-04-30 | Disposition: A | Discharge status: Home / Self Care | Attending: Emergency Medicine

## 2020-04-29 DIAGNOSIS — Z760 Encounter for issue of repeat prescription: Secondary | ICD-10-CM

## 2020-04-29 NOTE — ED Provider Notes (Signed)
ED Provider Notes by Ottis StainBaltz, Bradey Luzier R, MD at 04/29/20 2103                Author: Ottis StainBaltz, Princetta Uplinger R, MD  Service: --  Author Type: Physician       Filed: 04/29/20 2249  Date of Service: 04/29/20 2103  Status: Signed          Editor: Ottis StainBaltz, Mikiala Fugett R, MD (Physician)               EMERGENCY DEPARTMENT HISTORY AND PHYSICAL EXAM           Date: 04/29/2020   Patient Name: Brandon Galloway        History of Presenting Illness          Chief Complaint       Patient presents with        ?  Medication Refill             pt would like arefill on his Levetiracetam 500mg  twice a day           History Provided By: Patient      HPI: Brandon Galloway,  25 y.o. male presents to the ED with cc of medication refill.      25 year old male with no significant past medical history or surgical history presents emergency department requesting a medication refill.  Patient was seen in the emergency department  for seizure x2.  Was placed on Keppra and instructed to follow-up  with neurology.  Patient reports he ran out of his Keppra on Saturday.  Patient reports no seizures while on this medication.      Reviewing neurology notes, patient ordered MRI with and without contrast which he obtained as well as a echo and EEG.  Patient does not believe he has underwent these.  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  Patient denies any syncope.  Denies chest pain or palpitations.  Denies abdominal pain, nausea, vomiting or diarrhea.  Denies seizure.  Denies personal history of syncope or family history of sudden  cardiac death.      Patient attributed his symptoms to being out of his medications and presented to the emergency department requesting a refill.       There are no other complaints, changes, or physical findings at this time.      PCP: None        No current facility-administered medications on file prior to encounter.           Current Outpatient Medications on File Prior to Encounter          Medication  Sig  Dispense  Refill           ?  levETIRAcetam (Keppra) 500 mg tablet  Take 1 Tablet by mouth two (2) times a day.  60 Tablet  0             Past History        Past Medical History:     Past Medical History:        Diagnosis  Date         ?  Seizure [facility](HCC)             Past Surgical History:   History reviewed. No pertinent surgical history.      Family History:   History reviewed. No pertinent family history.  Social History:     Social History          Tobacco Use         ?  Smoking status:  Never Smoker     ?  Smokeless tobacco:  Never Used       Substance Use Topics         ?  Alcohol use:  Yes         ?  Drug use:  No           Allergies:   No Known Allergies           Review of Systems     Review of Systems    Constitutional: Negative for fever.    HENT: Negative for voice change.     Eyes: Negative for pain and redness.    Respiratory: Negative for cough and chest tightness.     Cardiovascular: Negative for chest pain and leg swelling.    Gastrointestinal: Negative for abdominal pain, diarrhea, nausea and vomiting.    Genitourinary: Negative for hematuria.    Musculoskeletal: Negative for gait problem.    Skin: Negative for color change, pallor and rash.    Neurological: Positive for light-headedness. Negative for syncope, facial asymmetry, weakness and headaches.    Hematological: Does not bruise/bleed easily.    Psychiatric/Behavioral: Negative for behavioral problems.    All other systems reviewed and are negative.           Physical Exam     Physical Exam   Vitals and nursing note reviewed.   Constitutional:        Comments: 25 year old male, resting in bed, no distress   HENT:       Head: Normocephalic and atraumatic.      Nose: Nose normal.      Mouth/Throat:      Mouth: Mucous membranes are moist.    Eyes:       Pupils: Pupils are equal, round, and reactive to light.   Cardiovascular:       Rate and Rhythm: Normal rate  and regular rhythm.      Pulses: Normal pulses.      Heart sounds: No murmur heard.   No friction rub. No gallop.     Pulmonary:       Effort: Pulmonary effort is normal.      Breath sounds: Normal breath sounds. No wheezing, rhonchi or rales.    Abdominal:      General: Abdomen is flat. There is no distension.      Palpations: Abdomen is soft.      Tenderness: There is no abdominal tenderness.     Musculoskeletal:          General: Normal range of motion.      Cervical back: Normal range of motion.      Right lower leg: No edema.      Left lower leg: No edema.    Skin:      General: Skin is warm and dry.      Capillary Refill: Capillary refill takes less than 2 seconds.    Neurological:       General: No focal deficit present.      Mental Status: He is alert.    Psychiatric:         Mood and Affect: Mood normal.               Diagnostic Study Results        Labs -  Recent Results (from the past 12 hour(s))     EKG, 12 LEAD, INITIAL          Collection Time: 04/29/20  9:07 PM         Result  Value  Ref Range            Ventricular Rate  67  BPM       Atrial Rate  67  BPM       P-R Interval  98  ms       QRS Duration  80  ms       Q-T Interval  378  ms       QTC Calculation (Bezet)  399  ms       Calculated P Axis  26  degrees       Calculated R Axis  44  degrees       Calculated T Axis  36  degrees       Diagnosis                 Sinus rhythm with short PR   No previous ECGs available              Radiologic Studies -      No orders to display          CT Results   (Last 48 hours)          None                 CXR Results   (Last 48 hours)          None                    Medical Decision Making     I am the first provider for this patient.      I reviewed the vital signs, available nursing notes, past medical history, past surgical history, family history and social history.      Vital Signs-Reviewed the patient's vital signs.   Patient Vitals for the past 12 hrs:            Temp  Pulse  Resp  BP  SpO2             04/29/20 1847  98 ??F (36.7 ??C)  82  14  139/77  98 %        Records Reviewed: Nursing Notes and Old Medical Records      Provider Notes (Medical Decision Making):       25 year old male presents emergency department requesting medication refill.  Vital signs are unremarkable.  Patient does report an episode of near syncope while at work.  Reviewed neurology note, unclear plan for AEDs as they did not refill these; the  note does not answer this question clearly.  Believe reasonable to give refill to bridge to neurology follow-up.  Patient did undergo MRI, advised to continue to follow-up for EEG and echo as ordered.      Regarding patient's near syncope, given prodrome, doubt cardiac cause, consider hypovolemia, vasovagal or orthostasis.  Will check EKG, do not believe other labs are necessary.  No murmur, no family history of sudden cardiac death.      Patient appears well, normal vitals, however given near syncope will check EKG.      ED Course:    Initial assessment performed. The patients presenting problems have been discussed, and they are in agreement with the care plan formulated and outlined  with them.  I have encouraged them to ask questions as they arise throughout their visit.        ED Course as of 04/29/20 2249       Mon Apr 29, 2020        2112  Preliminary EKG interpreted by me.  Shows normal sinus rhythm with a HR of 67.  No ST elevations.  Benign early repolarization changes.  No WPW, Brugada  or prolonged QTC. [MB]              ED Course User Index   [MB] Ottis Stain, MD        Ottis Stain, MD         Disposition:      Discharged      DISCHARGE PLAN:   1.      Discharge Medication List as of 04/29/2020  9:13 PM              START taking these medications          Details        !! levETIRAcetam (Keppra) 500 mg tablet  Take 1 Tablet by mouth two (2) times a day., Normal, Disp-60 Tablet, R-0               !! - Potential duplicate medications found. Please discuss with provider.               CONTINUE these medications which have NOT CHANGED          Details        !! levETIRAcetam (Keppra) 500 mg tablet  Take 1 Tablet by mouth two (2) times a day., Normal, Disp-60 Tablet, R-0               !! - Potential duplicate medications found. Please discuss with provider.               2.      Follow-up Information               Follow up With  Specialties  Details  Why  Contact Info              [facility]  Emergency Medicine    If symptoms worsen  [address]   [place] [address]   [phone]              Kara Pacer, MD  Neurology  In 1 week    [address]   [address]   [place] [address]   [phone]                3.  Return to ED if worse         Diagnosis        Clinical Impression:       1.  Near syncope         2.  Medication refill            Attestations:      Ottis Stain, MD      Please note that this dictation was completed with Dragon, the computer voice recognition software.  Quite often unanticipated grammatical, syntax, homophones, and other interpretive errors are  inadvertently transcribed by the computer software.  Please disregard these errors.  Please excuse any errors that have escaped final proofreading.  Thank you.

## 2020-04-30 MED ORDER — LEVETIRACETAM 500 MG TAB
500 mg | ORAL_TABLET | Freq: Two times a day (BID) | ORAL | 0 refills | Status: DC
Start: 2020-04-30 — End: 2020-04-29

## 2020-04-30 MED ORDER — LEVETIRACETAM 500 MG TAB
500 mg | ORAL_TABLET | Freq: Two times a day (BID) | ORAL | 0 refills | Status: AC
Start: 2020-04-30 — End: ?

## 2020-05-01 LAB — EKG 12-LEAD
Atrial Rate: 67 {beats}/min
P Axis: 26 degrees
P-R Interval: 98 ms
Q-T Interval: 378 ms
QRS Duration: 80 ms
QTc Calculation (Bazett): 399 ms
R Axis: 44 degrees
T Axis: 36 degrees
Ventricular Rate: 67 {beats}/min

## 2020-05-01 LAB — EKG, 12 LEAD, INITIAL
Atrial Rate: 67 {beats}/min
Calculated P Axis: 26 degrees
Calculated R Axis: 44 degrees
Calculated T Axis: 36 degrees
P-R Interval: 98 ms
Q-T Interval: 378 ms
QRS Duration: 80 ms
QTC Calculation (Bezet): 399 ms
Ventricular Rate: 67 {beats}/min

## 2020-07-17 ENCOUNTER — Encounter: Attending: Neurology
